# Patient Record
Sex: Male | Born: 1965 | Race: White | Hispanic: No | Marital: Married | Smoking: Never smoker
Health system: Southern US, Community
[De-identification: ages and names within clinical notes are randomized; demographics above are authoritative.]

## PROBLEM LIST (undated history)

## (undated) DIAGNOSIS — C4490 Unspecified malignant neoplasm of skin, unspecified: Secondary | ICD-10-CM

## (undated) HISTORY — DX: Unspecified malignant neoplasm of skin, unspecified: C44.90

---

## 2013-06-20 ENCOUNTER — Ambulatory Visit (INDEPENDENT_AMBULATORY_CARE_PROVIDER_SITE_OTHER): Payer: BC Managed Care – PPO | Admitting: Family Medicine

## 2013-06-20 ENCOUNTER — Encounter: Payer: Self-pay | Admitting: Family Medicine

## 2013-06-20 VITALS — BP 124/81 | HR 56 | Ht 71.0 in | Wt 194.0 lb

## 2013-06-20 DIAGNOSIS — Z13220 Encounter for screening for lipoid disorders: Secondary | ICD-10-CM

## 2013-06-20 DIAGNOSIS — Z85828 Personal history of other malignant neoplasm of skin: Secondary | ICD-10-CM | POA: Insufficient documentation

## 2013-06-20 DIAGNOSIS — N529 Male erectile dysfunction, unspecified: Secondary | ICD-10-CM

## 2013-06-20 DIAGNOSIS — L219 Seborrheic dermatitis, unspecified: Secondary | ICD-10-CM

## 2013-06-20 DIAGNOSIS — Z0000 Encounter for general adult medical examination without abnormal findings: Secondary | ICD-10-CM

## 2013-06-20 DIAGNOSIS — Z131 Encounter for screening for diabetes mellitus: Secondary | ICD-10-CM

## 2013-06-20 MED ORDER — KETOCONAZOLE 2 % EX SHAM: 1.0000 "application " | MEDICATED_SHAMPOO | CUTANEOUS | Status: DC

## 2013-06-20 MED ORDER — TADALAFIL 20 MG PO TABS: 20.0000 mg | ORAL_TABLET | Freq: Every day | ORAL | Status: DC | PRN

## 2013-06-20 NOTE — Progress Notes (Signed)
CC: Dennis Stephenson is a 48 y.o. male is here for Establish Care   Subjective: HPI:  Colonoscopy: No current indication Prostate: Discussed screening risks/beneifts with patient today, he reports it PSAs were checked more frequently than annually at his former urology office and were always less than 4 to his knowledge.  Influenza Vaccine: Out of Season Pneumovax: No current indication Td/Tdap: He is unsure about this we are requesting outside records Zoster: (Start 48 yo)  Very pleasant 48 year old here to establish care requesting complete physical exam.  Requesting refills for Cialis due to erectile dysfunction described as difficulty maintaining an erection. He takes a 20 mg dose he will provide relief of the above issues for about a length of the weekend.  Requesting counseling on dandruff that has been present for matter of years with no benefit from [facility] or any other over-the-counter shampoos  Review of Systems - General ROS: negative for - chills, fever, night sweats, weight gain or weight loss Ophthalmic ROS: negative for - decreased vision Psychological ROS: negative for - anxiety or depression ENT ROS: negative for - hearing change, nasal congestion, tinnitus or allergies Hematological and Lymphatic ROS: negative for - bleeding problems, bruising or swollen lymph nodes Breast ROS: negative Respiratory ROS: no cough, shortness of breath, or wheezing Cardiovascular ROS: no chest pain or dyspnea on exertion Gastrointestinal ROS: no abdominal pain, change in bowel habits, or black or bloody stools Genito-Urinary ROS: negative for - genital discharge, genital ulcers, incontinence or abnormal bleeding from genitals Musculoskeletal ROS: negative for - joint pain or muscle pain Neurological ROS: negative for - headaches or memory loss Dermatological ROS: negative for lumps, mole changes, rash and skin lesion changes other than that described above  Past Medical History   Diagnosis Date  . Skin cancer     x 3    History reviewed. No pertinent past surgical history. History reviewed. No pertinent family history.  History   Social History  . Marital Status: Married    Spouse Name: N/A    Number of Children: N/A  . Years of Education: N/A   Occupational History  . Not on file.   Social History Main Topics  . Smoking status: Never Smoker   . Smokeless tobacco: Not on file  . Alcohol Use: Not on file     Comment: 1/2  . Drug Use: Yes  . Sexual Activity: Yes    Partners: Female   Other Topics Concern  . Not on file   Social History Narrative  . No narrative on file     Objective: BP 124/81  Pulse 56  Ht 5\' 11"  (1.803 m)  Wt 194 lb (87.998 kg)  BMI 27.07 kg/m2  General: No Acute Distress HEENT: Atraumatic, normocephalic, conjunctivae normal without scleral icterus.  No nasal discharge, hearing grossly intact, TMs with good landmarks bilaterally with no middle ear abnormalities, posterior pharynx clear without oral lesions. Neck: Supple, trachea midline, no cervical nor supraclavicular adenopathy. Pulmonary: Clear to auscultation bilaterally without wheezing, rhonchi, nor rales. Cardiac: Regular rate and rhythm.  No murmurs, rubs, nor gallops. No peripheral edema.  2+ peripheral pulses bilaterally. Abdomen: Soft flat nontender MSK: Grossly intact, no signs of weakness.  Full strength throughout upper and lower extremities.  Full ROM in upper and lower extremities.  No midline spinal tenderness. Neuro: Gait unremarkable, CN II-XII grossly intact.  C5-C6 Reflex 2/4 Bilaterally, L4 Reflex 2/4 Bilaterally.  Cerebellar function intact. Skin: No rashes. Psych: Alert and oriented to person/place/time.  Thought process normal. No anxiety/depression.  Assessment & Plan: Neely was seen today for establish care.  Diagnoses and associated orders for this visit:  Annual physical exam  Diabetes mellitus screening - BASIC METABOLIC PANEL WITH  GFR  Lipid screening - Lipid panel  History of skin cancer - Ambulatory referral to Dermatology  Erectile dysfunction - Cancel: Ambulatory referral to Dermatology - tadalafil (CIALIS) 20 MG tablet; Take 1 tablet (20 mg total) by mouth daily as needed for erectile dysfunction.  Seborrheic dermatitis - ketoconazole (NIZORAL) 2 % shampoo; Apply 1 application topically 2 (two) times a week.    Healthy lifestyle interventions including but not limited to regular exercise, a healthy low fat diet, moderation of salt intake, the dangers of tobacco/alcohol/recreational drug use, nutrition supplementation, and accident avoidance were discussed with the patient and a handout was provided for future reference. Dermatology referral for routine followup of his history of skin cancers, he believes it was not melanoma rather basal cell or squamous cell Erectile dysfunction: Control and as needed Cialis Seborrheic dermatitis, start ketoconazole   Return in about 1 year (around 06/21/2014).

## 2013-07-10 ENCOUNTER — Encounter: Payer: Self-pay | Admitting: Family Medicine

## 2013-12-24 ENCOUNTER — Other Ambulatory Visit: Payer: Self-pay | Admitting: *Deleted

## 2013-12-24 MED ORDER — TADALAFIL 20 MG PO TABS: 20.0000 mg | ORAL_TABLET | Freq: Every day | ORAL | Status: DC | PRN

## 2014-08-05 ENCOUNTER — Telehealth: Payer: Self-pay | Admitting: *Deleted

## 2014-08-05 NOTE — Telephone Encounter (Signed)
Pt called wanting diflucan called in for a rash on his back. Left message on vm that his rash needed to be evaluated and it has been over a year since he has been seen so definitely needs an appointment

## 2014-08-18 ENCOUNTER — Encounter: Payer: Self-pay | Admitting: Family Medicine

## 2014-08-18 ENCOUNTER — Ambulatory Visit (INDEPENDENT_AMBULATORY_CARE_PROVIDER_SITE_OTHER): Payer: BLUE CROSS/BLUE SHIELD | Admitting: Family Medicine

## 2014-08-18 VITALS — BP 131/81 | HR 40 | Ht 71.0 in | Wt 191.0 lb

## 2014-08-18 DIAGNOSIS — Z0000 Encounter for general adult medical examination without abnormal findings: Secondary | ICD-10-CM | POA: Diagnosis not present

## 2014-08-18 DIAGNOSIS — H9193 Unspecified hearing loss, bilateral: Secondary | ICD-10-CM

## 2014-08-18 DIAGNOSIS — H9190 Unspecified hearing loss, unspecified ear: Secondary | ICD-10-CM | POA: Insufficient documentation

## 2014-08-18 MED ORDER — SILDENAFIL CITRATE 20 MG PO TABS: ORAL_TABLET | ORAL | Status: DC

## 2014-08-18 MED ORDER — FLUCONAZOLE 100 MG PO TABS: ORAL_TABLET | ORAL | Status: DC

## 2014-08-18 NOTE — Progress Notes (Signed)
CC: Dennis Stephenson is a 49 y.o. male is here for Annual Exam   Subjective: HPI:  Colonoscopy: No current indication Prostate: Discussed screening risks/beneifts with patient today, no current indication   Influenza Vaccine: No current indication Pneumovax: No current indication Td/Tdap: UTD Zoster: (Start 49 yo)  He would like another something cheaper than Cialis, this medication works great for erectile dysfunction but is $70 for 6 tablets and financially this is costly for him.  Complains of itching on the back present for the last couple of months. It's responded to fluconazole past no intervention over the past few months.  He's had a little bit of decreased hearing over the past year. He is not sure selective hearing or something more serious. It seems to affect all frequencies in both ears, mild in severity   Review of Systems - General ROS: negative for - chills, fever, night sweats, weight gain or weight loss Ophthalmic ROS: negative for - decreased vision Psychological ROS: negative for - anxiety or depression ENT ROS: negative for - , nasal congestion, tinnitus or allergies Hematological and Lymphatic ROS: negative for - bleeding problems, bruising or swollen lymph nodes Breast ROS: negative Respiratory ROS: no cough, shortness of breath, or wheezing Cardiovascular ROS: no chest pain or dyspnea on exertion Gastrointestinal ROS: no abdominal pain, change in bowel habits, or black or bloody stools Genito-Urinary ROS: negative for - genital discharge, genital ulcers, incontinence or abnormal bleeding from genitals Musculoskeletal ROS: negative for - joint pain or muscle pain Neurological ROS: negative for - headaches or memory loss Dermatological ROS: negative for lumps, mole changes, rash and skin lesion changes other than that described above  Past Medical History  Diagnosis Date  . Skin cancer     x 3    No past surgical history on file. No family history on file.   History   Social History  . Marital Status: Married    Spouse Name: N/A  . Number of Children: N/A  . Years of Education: N/A   Occupational History  . Not on file.   Social History Main Topics  . Smoking status: Never Smoker   . Smokeless tobacco: Not on file  . Alcohol Use: Not on file     Comment: 1/2  . Stephenson Use: Yes  . Sexual Activity:    Partners: Female   Other Topics Concern  . Not on file   Social History Narrative     Objective: BP 131/81 mmHg  Pulse 40  Ht 5\' 11"  (1.803 m)  Wt 191 lb (86.637 kg)  BMI 26.65 kg/m2  General: No Acute Distress HEENT: Atraumatic, normocephalic, conjunctivae normal without scleral icterus.  No nasal discharge, hearing grossly intact, TMs with good landmarks bilaterally with no middle ear abnormalities, posterior pharynx clear without oral lesions. Neck: Supple, trachea midline, no cervical nor supraclavicular adenopathy. Pulmonary: Clear to auscultation bilaterally without wheezing, rhonchi, nor rales. Cardiac: Regular rate and rhythm.  No murmurs, rubs, nor gallops. No peripheral edema.  2+ peripheral pulses bilaterally. Abdomen: Bowel sounds normal.  No masses.  Non-tender without rebound.  Negative Murphy's sign. MSK: Grossly intact, no signs of weakness.  Full strength throughout upper and lower extremities.  Full ROM in upper and lower extremities.  No midline spinal tenderness. Neuro: Gait unremarkable, CN II-XII grossly intact.  C5-C6 Reflex 2/4 Bilaterally, L4 Reflex 2/4 Bilaterally.  Cerebellar function intact. Skin: Mild tinea versicolor on the back Psych: Alert and oriented to person/place/time.  Thought process normal. No anxiety/depression.  Assessment & Plan: Dennis Stephenson was seen today for annual exam.  Diagnoses and all orders for this visit:  Annual physical exam Orders: -     Lipid panel -     COMPLETE METABOLIC PANEL WITH GFR -     CBC  Hearing loss, bilateral  Other orders -     sildenafil (REVATIO) 20 MG  tablet; 2-3 by mouth as needed for sex -     fluconazole (DIFLUCAN) 100 MG tablet; Two by mouth every week as needed for skin-yeast infection.   Healthy lifestyle interventions including but not limited to regular exercise, a healthy low fat diet, moderation of salt intake, the dangers of tobacco/alcohol/recreational Stephenson use, nutrition supplementation, and accident avoidance were discussed with the patient and a handout was provided for future reference. Generic Viagra prescription provided to take 2 Dennis Stephenson Mild hearing loss bilaterally all frequencies heard at 40 dB, discussed option of formal testing at audiology, joint decision to hold off on this unless symptoms worsen, will recheck in one year. Discussed avoiding loud noises to help minimize any future progression of hearing loss. Diflucan for tinea versicolor on the back.   Return in about 1 year (around 08/18/2015).

## 2014-08-18 NOTE — Patient Instructions (Signed)
Dr. Terriann Difonzo's General Advice Following Your Complete Physical Exam  The Benefits of Regular Exercise: Unless you suffer from an uncontrolled cardiovascular condition, studies strongly suggest that regular exercise and physical activity will add to both the quality and length of your life.  The World Health Organization recommends 150 minutes of moderate intensity aerobic activity every week.  This is best split over 3-4 days a week, and can be as simple as a brisk walk for just over 35 minutes "most days of the week".  This type of exercise has been shown to lower LDL-Cholesterol, lower average blood sugars, lower blood pressure, lower cardiovascular disease risk, improve memory, and increase one's overall sense of wellbeing.  The addition of anaerobic (or "strength training") exercises offers additional benefits including but not limited to increased metabolism, prevention of osteoporosis, and improved overall cholesterol levels.  How Can I Strive For A Low-Fat Diet?: Current guidelines recommend that 25-35 percent of your daily energy (food) intake should come from fats.  One might ask how can this be achieved without having to dissect each meal on a daily basis?  Switch to skim or 1% milk instead of whole milk.  Focus on lean meats such as ground turkey, fresh fish, baked chicken, and lean cuts of beef as your source of dietary protein.  Limit saturated fat consumption to less than 10% of your daily caloric intake.  Limit trans fatty acid consumption primarily by limiting synthetic trans fats such as partially hydrogenated oils (Ex: fried fast foods).  Substitute olive or vegetable oil for solid fats where possible.  Moderation of Salt Intake: Provided you don't carry a diagnosis of congestive heart failure nor renal failure, I recommend a daily allowance of no more than 2300 mg of salt (sodium).  Keeping under this daily goal is associated with a decreased risk of cardiovascular events, creeping  above it can lead to elevated blood pressures and increases your risk of cardiovascular events.  Milligrams (mg) of salt is listed on all nutrition labels, and your daily intake can add up faster than you think.  Most canned and frozen dinners can pack in over half your daily salt allowance in one meal.    Lifestyle Health Risks: Certain lifestyle choices carry specific health risks.  As you may already know, tobacco use has been associated with increasing one's risk of cardiovascular disease, pulmonary disease, numerous cancers, among many other issues.  What you may not know is that there are medications and nicotine replacement strategies that can more than double your chances of successfully quitting.  I would be thrilled to help manage your quitting strategy if you currently use tobacco products.  When it comes to alcohol use, I've yet to find an "ideal" daily allowance.  Provided an individual does not have a medical condition that is exacerbated by alcohol consumption, general guidelines determine "safe drinking" as no more than two standard drinks for a man or no more than one standard drink for a male per day.  However, much debate still exists on whether any amount of alcohol consumption is technically "safe".  My general advice, keep alcohol consumption to a minimum for general health promotion.  If you or others believe that alcohol, tobacco, or recreational drug use is interfering with your life, I would be happy to provide confidential counseling regarding treatment options.  General "Over The Counter" Nutrition Advice: Postmenopausal women should aim for a daily calcium intake of 1200 mg, however a significant portion of this might already be   provided by diets including milk, yogurt, cheese, and other dairy products.  Vitamin D has been shown to help preserve bone density, prevent fatigue, and has even been shown to help reduce falls in the elderly.  Ensuring a daily intake of 800 Units of  Vitamin D is a good place to start to enjoy the above benefits, we can easily check your Vitamin D level to see if you'd potentially benefit from supplementation beyond 800 Units a day.  Folic Acid intake should be of particular concern to women of childbearing age.  Daily consumption of 400-800 mcg of Folic Acid is recommended to minimize the chance of spinal cord defects in a fetus should pregnancy occur.    For many adults, accidents still remain one of the most common culprits when it comes to cause of death.  Some of the simplest but most effective preventitive habits you can adopt include regular seatbelt use, proper helmet use, securing firearms, and regularly testing your smoke and carbon monoxide detectors.  [facility] Stephenson [facility] [address], [address] [place],  [address] Phone: [phone]  

## 2014-08-22 LAB — CBC
HEMATOCRIT: 46.4 % (ref 39.0–52.0)
Hemoglobin: 15.9 g/dL (ref 13.0–17.0)
MCH: 31.2 pg (ref 26.0–34.0)
MCHC: 34.3 g/dL (ref 30.0–36.0)
MCV: 91 fL (ref 78.0–100.0)
MPV: 10.7 fL (ref 8.6–12.4)
PLATELETS: 206 10*3/uL (ref 150–400)
RBC: 5.1 MIL/uL (ref 4.22–5.81)
RDW: 13.4 % (ref 11.5–15.5)
WBC: 4.2 10*3/uL (ref 4.0–10.5)

## 2014-08-22 LAB — COMPLETE METABOLIC PANEL WITH GFR
ALT: 24 U/L (ref 9–46)
AST: 24 U/L (ref 10–40)
Albumin: 4.3 g/dL (ref 3.6–5.1)
Alkaline Phosphatase: 80 U/L (ref 40–115)
BILIRUBIN TOTAL: 0.8 mg/dL (ref 0.2–1.2)
BUN: 18 mg/dL (ref 7–25)
CALCIUM: 9.6 mg/dL (ref 8.6–10.3)
CHLORIDE: 105 mmol/L (ref 98–110)
CO2: 29 mmol/L (ref 20–31)
CREATININE: 0.88 mg/dL (ref 0.60–1.35)
GFR, Est Non African American: >89 mL/min (ref >=60)
Glucose, Bld: 78 mg/dL (ref 65–99)
Potassium: 4.5 mmol/L (ref 3.5–5.3)
Sodium: 141 mmol/L (ref 135–146)
Total Protein: 7.2 g/dL (ref 6.1–8.1)

## 2014-08-22 LAB — LIPID PANEL
Cholesterol: 149 mg/dL (ref 125–200)
HDL: 59 mg/dL (ref >=40)
LDL CALC: 74 mg/dL (ref <130)
Total CHOL/HDL Ratio: 2.5 Ratio (ref <=5.0)
Triglycerides: 79 mg/dL (ref <150)
VLDL: 16 mg/dL (ref <30)

## 2015-01-18 ENCOUNTER — Emergency Department (HOSPITAL_COMMUNITY): Payer: BLUE CROSS/BLUE SHIELD

## 2015-01-18 ENCOUNTER — Encounter (HOSPITAL_COMMUNITY): Payer: Self-pay | Admitting: Emergency Medicine

## 2015-01-18 ENCOUNTER — Emergency Department (HOSPITAL_COMMUNITY)
Admission: EM | Admit: 2015-01-18 | Discharge: 2015-01-18 | Disposition: A | Discharge status: Home / Self Care | Payer: BLUE CROSS/BLUE SHIELD | Attending: Emergency Medicine | Admitting: Emergency Medicine

## 2015-01-18 DIAGNOSIS — Z79899 Other long term (current) drug therapy: Secondary | ICD-10-CM | POA: Insufficient documentation

## 2015-01-18 DIAGNOSIS — R079 Chest pain, unspecified: Secondary | ICD-10-CM | POA: Diagnosis not present

## 2015-01-18 DIAGNOSIS — Z85828 Personal history of other malignant neoplasm of skin: Secondary | ICD-10-CM | POA: Diagnosis not present

## 2015-01-18 LAB — BASIC METABOLIC PANEL
ANION GAP: 8 (ref 5–15)
BUN: 14 mg/dL (ref 6–20)
CHLORIDE: 102 mmol/L (ref 101–111)
CO2: 31 mmol/L (ref 22–32)
Calcium: 9.6 mg/dL (ref 8.9–10.3)
Creatinine, Ser: 0.89 mg/dL (ref 0.61–1.24)
Glucose, Bld: 101 mg/dL — ABNORMAL HIGH (ref 65–99)
POTASSIUM: 4.1 mmol/L (ref 3.5–5.1)
SODIUM: 141 mmol/L (ref 135–145)

## 2015-01-18 LAB — I-STAT TROPONIN, ED
Troponin i, poc: 0 ng/mL (ref 0.00–0.08)
Troponin i, poc: 0 ng/mL (ref 0.00–0.08)

## 2015-01-18 LAB — CBC
HEMATOCRIT: 45 % (ref 39.0–52.0)
HEMOGLOBIN: 15.2 g/dL (ref 13.0–17.0)
MCH: 31 pg (ref 26.0–34.0)
MCHC: 33.8 g/dL (ref 30.0–36.0)
MCV: 91.8 fL (ref 78.0–100.0)
Platelets: 182 10*3/uL (ref 150–400)
RBC: 4.9 MIL/uL (ref 4.22–5.81)
RDW: 12.1 % (ref 11.5–15.5)
WBC: 5.8 10*3/uL (ref 4.0–10.5)

## 2015-01-18 MED ORDER — HYDROCODONE-ACETAMINOPHEN 5-325 MG PO TABS
1.0000 | ORAL_TABLET | Freq: Once | ORAL | Status: AC
  Administered 2015-01-18: 1 via ORAL
  Filled 2015-01-18: qty 1

## 2015-01-18 MED ORDER — IOHEXOL 350 MG/ML SOLN
100.0000 mL | Freq: Once | INTRAVENOUS | Status: AC | PRN
  Administered 2015-01-18: 100 mL via INTRAVENOUS

## 2015-01-18 MED ORDER — TRAMADOL HCL 50 MG PO TABS: 50.0000 mg | ORAL_TABLET | Freq: Four times a day (QID) | ORAL | Status: AC | PRN

## 2015-01-18 NOTE — ED Notes (Signed)
Pt states that he began having lt sided chest pain yesterday that subsided and came back today.  Pt states that he is not SOB and denies NVD.  Has not had this pain before.

## 2015-01-18 NOTE — Discharge Instructions (Signed)
Follow-up with her family doctor this week for recheck

## 2015-01-18 NOTE — ED Notes (Signed)
MD at bedside. 

## 2015-01-18 NOTE — ED Provider Notes (Signed)
CSN: [id number]     Arrival date & time 01/18/15  [id number] History   First MD Initiated Contact with Patient 01/18/15 1200     Chief Complaint  Patient presents with  . Chest Pain     (Consider location/radiation/quality/duration/timing/severity/associated sxs/prior Treatment) Patient is a 49 y.o. male presenting with chest pain. The history is provided by the patient (The patient states that he's been having some left-sided chest pain. The pain does not seem to be aggravated by movement or exertion. Patient exercises at least 5 times a week without any symptoms).  Chest Pain Pain location:  L chest Pain quality: aching   Pain radiates to:  Does not radiate Pain radiates to the back: no   Pain severity:  Mild Onset quality:  Gradual Timing:  Intermittent Chronicity:  New Context: not breathing   Associated symptoms: no abdominal pain, no back pain, no cough, no fatigue and no headache     Past Medical History  Diagnosis Date  . Skin cancer     x 3   No past surgical history on file. No family history on file. Social History  Substance Use Topics  . Smoking status: Never Smoker   . Smokeless tobacco: None  . Alcohol Use: None     Comment: 1/2    Review of Systems  Constitutional: Negative for appetite change and fatigue.  HENT: Negative for congestion, ear discharge and sinus pressure.   Eyes: Negative for discharge.  Respiratory: Negative for cough.   Cardiovascular: Positive for chest pain.  Gastrointestinal: Negative for abdominal pain and diarrhea.  Genitourinary: Negative for frequency and hematuria.  Musculoskeletal: Negative for back pain.  Skin: Negative for rash.  Neurological: Negative for seizures and headaches.  Psychiatric/Behavioral: Negative for hallucinations.      Allergies  Review of patient's allergies indicates no known allergies.  Home Medications   Prior to Admission medications   Medication Sig Start Date End Date Taking? Authorizing  Provider  LYSINE PO Take 1 tablet by mouth daily.    Yes Historical Provider, MD  Multiple Vitamin (MULTIVITAMIN) tablet Take 1 tablet by mouth daily.   Yes Historical Provider, MD  NON FORMULARY Take 1 capsule by mouth 3 (three) times daily. JYM supplement   Yes Historical Provider, MD  sildenafil (REVATIO) 20 MG tablet 2-3 by mouth as needed for sex Patient taking differently: Take 20 mg by mouth as needed (erectile dys). 2-3 by mouth as needed for sex 08/18/14  Yes Sean Hommel, DO  fluconazole (DIFLUCAN) 100 MG tablet Two by mouth every week as needed for skin-yeast infection. Patient not taking: Reported on 01/18/2015 08/18/14   Marcial Pacas, DO  ketoconazole (NIZORAL) 2 % shampoo Apply 1 application topically 2 (two) times a week. Patient not taking: Reported on 01/18/2015 06/20/13   Marcial Pacas, DO  traMADol (ULTRAM) 50 MG tablet Take 1 tablet (50 mg total) by mouth every 6 (six) hours as needed. 01/18/15   Milton Ferguson, MD   BP 136/88 mmHg  Pulse 59  Temp(Src) 98.2 F (36.8 C) (Oral)  Resp 13  SpO2 99% Physical Exam  Constitutional: He is oriented to person, place, and time. He appears well-developed.  HENT:  Head: Normocephalic.  Eyes: Conjunctivae and EOM are normal. No scleral icterus.  Neck: Neck supple. No thyromegaly present.  Cardiovascular: Normal rate and regular rhythm.  Exam reveals no gallop and no friction rub.   No murmur heard. Pulmonary/Chest: No stridor. He has no wheezes. He has no rales. He  exhibits no tenderness.  Abdominal: He exhibits no distension. There is no tenderness. There is no rebound.  Musculoskeletal: Normal range of motion. He exhibits no edema.  Lymphadenopathy:    He has no cervical adenopathy.  Neurological: He is oriented to person, place, and time. He exhibits normal muscle tone. Coordination normal.  Skin: No rash noted. No erythema.  Psychiatric: He has a normal mood and affect. His behavior is normal.    ED Course  Procedures  (including critical care time) Labs Review Labs Reviewed  BASIC METABOLIC PANEL - Abnormal; Notable for the following:    Glucose, Bld 101 (*)    All other components within normal limits  CBC  I-STAT TROPOININ, ED  Randolm Idol, ED    Imaging Review Dg Chest 2 View  01/18/2015  CLINICAL DATA:  Left chest pain since yesterday. EXAM: CHEST  2 VIEW COMPARISON:  None. FINDINGS: The heart size and mediastinal contours are within normal limits. Both lungs are clear. The visualized skeletal structures are unremarkable. IMPRESSION: No active cardiopulmonary disease. Electronically Signed   By: Abelardo Diesel M.D.   On: 01/18/2015 10:57   Ct Angio Chest Pe W/cm &/or Wo Cm  01/18/2015  CLINICAL DATA:  Intermittent left-sided chest pain since 01/17/2015. Initial encounter. EXAM: CT ANGIOGRAPHY CHEST WITH CONTRAST TECHNIQUE: Multidetector CT imaging of the chest was performed using the standard protocol during bolus administration of intravenous contrast. Multiplanar CT image reconstructions and MIPs were obtained to evaluate the vascular anatomy. CONTRAST:  100 mL OMNIPAQUE IOHEXOL 350 MG/ML SOLN COMPARISON:  PA and lateral chest earlier today. FINDINGS: No pulmonary embolus is identified. No pleural or pericardial effusion. Heart size is normal. No axillary, hilar or mediastinal lymphadenopathy. No calcific aortic or coronary atherosclerosis is identified. The lungs are clear. Incidentally imaged upper abdomen shows fatty infiltration of the liver. No focal bony abnormality is identified. Review of the MIP images confirms the above findings. IMPRESSION: Negative for pulmonary embolus.  Negative chest CT. Fatty infiltration of the liver. Electronically Signed   By: Inge Rise M.D.   On: 01/18/2015 14:21   I have personally reviewed and evaluated these images and lab results as part of my medical decision-making.   EKG Interpretation   Date/Time:  Monday January 18 2015 10:02:47  EST Ventricular Rate:  57 PR Interval:  179 QRS Duration: 126 QT Interval:  436 QTC Calculation: 424 R Axis:   75 Text Interpretation:  Sinus rhythm Nonspecific intraventricular conduction  delay Confirmed by Maripat Borba  MD, Tovia Kisner [phone]) on 01/18/2015 12:02:27 PM  Also confirmed by Quinn Quam  MD, Broadus Nolon [phone])  on 01/18/2015 3:20:34 PM      MDM   Final diagnoses:  Chest pain at rest    Labs and CT and EKG unremarkable. Doubt coronary artery disease. Suspect musculoskeletal problem. Patient given Ultram and will follow-up with his PCP this week    Milton Ferguson, MD 01/18/15 1537

## 2015-01-18 NOTE — ED Notes (Signed)
EKG completed at triage by Denisa, given to Dr Lacinda Axon at [phone]

## 2015-01-21 ENCOUNTER — Encounter: Payer: Self-pay | Admitting: Family Medicine

## 2015-01-21 ENCOUNTER — Ambulatory Visit (INDEPENDENT_AMBULATORY_CARE_PROVIDER_SITE_OTHER): Payer: BLUE CROSS/BLUE SHIELD | Admitting: Family Medicine

## 2015-01-21 VITALS — BP 116/76 | HR 59 | Ht 71.0 in | Wt 203.0 lb

## 2015-01-21 DIAGNOSIS — R0789 Other chest pain: Secondary | ICD-10-CM

## 2015-01-21 DIAGNOSIS — R079 Chest pain, unspecified: Secondary | ICD-10-CM | POA: Insufficient documentation

## 2015-01-21 NOTE — Progress Notes (Signed)
       Dennis Stephenson is a 49 y.o. male who presents to [facility] today for  Hospital follow up. Patient was seen int he ED on Dec 26th for chest pain. He had a normal EKG, CT-angiogram of the chest and negative Troponins x2.  He feels completely asymptomatic since the 27th. He notes the chest pain was never exertional. He is able to exercise 5 times a week without any symptoms. No shortness of breath fevers chills nausea vomiting or diarrhea. He feels well otherwise. Pain was located in the left lateral ribs and did not radiate.  Patient does note that he snores and stops breathing at night and feels very tired. He's concerned he may have sleep apnea.   Past Medical History  Diagnosis Date  . Skin cancer     x 3   No past surgical history on file. Social History  Substance Use Topics  . Smoking status: Never Smoker   . Smokeless tobacco: Not on file  . Alcohol Use: Not on file     Comment: 1/2   family history is not on file.  ROS as above Medications: Current Outpatient Prescriptions  Medication Sig Dispense Refill  . fluconazole (DIFLUCAN) 100 MG tablet Two by mouth every week as needed for skin-yeast infection. 20 tablet 0  . ketoconazole (NIZORAL) 2 % shampoo Apply 1 application topically 2 (two) times a week. 120 mL 3  . LYSINE PO Take 1 tablet by mouth daily.     . Multiple Vitamin (MULTIVITAMIN) tablet Take 1 tablet by mouth daily.    . NON FORMULARY Take 1 capsule by mouth 3 (three) times daily. JYM supplement    . sildenafil (REVATIO) 20 MG tablet 2-3 by mouth as needed for sex (Patient taking differently: Take 20 mg by mouth as needed (erectile dys). 2-3 by mouth as needed for sex) 50 tablet 4  . traMADol (ULTRAM) 50 MG tablet Take 1 tablet (50 mg total) by mouth every 6 (six) hours as needed. 20 tablet 0   No current facility-administered medications for this visit.   No  Known Allergies   Exam:  BP 116/76 mmHg  Pulse 59  Ht 5\' 11"  (1.803 m)  Wt 203 lb (92.08 kg)  BMI 28.33 kg/m2 Gen: Well NAD HEENT: EOMI,  MMM Lungs: Normal work of breathing. CTABL Heart: RRR no MRG Abd: NABS, Soft. Nondistended, Nontender Exts: Brisk capillary refill, warm and well perfused.    STOP BANG: Snore:    Yes  Tired:     Yes Observed stop breathing:  Yes Hypertension:   No  BMI >35:   No Age >50:   No Neck > 16 inches:  Yes Male gender:   Yes ------------------------------------------ Total:     5/8  No results found for this or any previous visit (from the past 24 hour(s)). No results found.   Please see individual assessment and plan sections.

## 2015-01-21 NOTE — Assessment & Plan Note (Signed)
Very low risk for cardiac etiology. This was likely costochondritis. Given age we'll obtain an exercise stress EKG test.  Additionally patient has symptoms concerning for sleep apnea that may be contributing. Plan for home sleep study.  Follow-up with PCP.

## 2015-01-21 NOTE — Patient Instructions (Signed)
Thank you for coming in today. We will do a stress test and a home sleep study.  Expect to hear from both groups soon.  Follow up with Dr Lemmie Evens.  Call or go to the emergency room if you get worse, have trouble breathing, have chest pains, or palpitations.   Chest Wall Pain Chest wall pain is pain in or around the bones and muscles of your chest. Sometimes, an injury causes this pain. Sometimes, the cause may not be known. This pain may take several weeks or longer to get better. HOME CARE INSTRUCTIONS  Pay attention to any changes in your symptoms. Take these actions to help with your pain:   Rest as told by your health care provider.   Avoid activities that cause pain. These include any activities that use your chest muscles or your abdominal and side muscles to lift heavy items.   If directed, apply ice to the painful area:  Put ice in a plastic bag.  Place a towel between your skin and the bag.  Leave the ice on for 20 minutes, 2-3 times per day.  Take over-the-counter and prescription medicines only as told by your health care provider.  Do not use tobacco products, including cigarettes, chewing tobacco, and e-cigarettes. If you need help quitting, ask your health care provider.  Keep all follow-up visits as told by your health care provider. This is important. SEEK MEDICAL CARE IF:  You have a fever.  Your chest pain becomes worse.  You have new symptoms. SEEK IMMEDIATE MEDICAL CARE IF:  You have nausea or vomiting.  You feel sweaty or light-headed.  You have a cough with phlegm (sputum) or you cough up blood.  You develop shortness of breath.   This information is not intended to replace advice given to you by your health care provider. Make sure you discuss any questions you have with your health care provider.   Document Released: 01/09/2005 Document Revised: 09/30/2014 Document Reviewed: 04/06/2014 Elsevier Interactive Patient Education [employer].

## 2015-02-08 ENCOUNTER — Encounter (HOSPITAL_BASED_OUTPATIENT_CLINIC_OR_DEPARTMENT_OTHER): Payer: BLUE CROSS/BLUE SHIELD

## 2015-03-10 ENCOUNTER — Ambulatory Visit (INDEPENDENT_AMBULATORY_CARE_PROVIDER_SITE_OTHER): Payer: BLUE CROSS/BLUE SHIELD

## 2015-03-10 DIAGNOSIS — R0789 Other chest pain: Secondary | ICD-10-CM | POA: Diagnosis not present

## 2015-03-10 LAB — EXERCISE TOLERANCE TEST
CHL CUP RESTING HR STRESS: 63 {beats}/min
CHL CUP STRESS STAGE 1 DBP: 98 mmHg
CHL CUP STRESS STAGE 1 SBP: 136 mmHg
CHL CUP STRESS STAGE 10 DBP: 74 mmHg
CHL CUP STRESS STAGE 10 HR: 86 {beats}/min
CHL CUP STRESS STAGE 10 SBP: 114 mmHg
CHL CUP STRESS STAGE 2 GRADE: 0 %
CHL CUP STRESS STAGE 4 HR: 80 {beats}/min
CHL CUP STRESS STAGE 4 SBP: 140 mmHg
CHL CUP STRESS STAGE 4 SPEED: 1.7 mph
CHL CUP STRESS STAGE 5 GRADE: 12 %
CHL CUP STRESS STAGE 5 HR: 98 {beats}/min
CHL CUP STRESS STAGE 6 HR: 129 {beats}/min
CHL CUP STRESS STAGE 7 DBP: 92 mmHg
CHL CUP STRESS STAGE 7 GRADE: 16 %
CHL CUP STRESS STAGE 8 HR: 162 {beats}/min
CHL CUP STRESS STAGE 9 GRADE: 0 %
CHL CUP STRESS STAGE 9 HR: 139 {beats}/min
CHL CUP STRESS STAGE 9 SBP: 188 mmHg
CHL CUP STRESS STAGE 9 SPEED: 1.5 mph
CHL RATE OF PERCEIVED EXERTION: 17
CSEPHR: 95 %
CSEPPMHR: 94 %
Estimated workload: 15.1 METS
Exercise duration (min): 13 min
Exercise duration (sec): 0 s
MPHR: 171 {beats}/min
Peak HR: 162 {beats}/min
Stage 1 Grade: 0 %
Stage 1 HR: 61 {beats}/min
Stage 1 Speed: 0 mph
Stage 10 Grade: 0 %
Stage 10 Speed: 0 mph
Stage 2 HR: 72 {beats}/min
Stage 2 Speed: 0.8 mph
Stage 3 Grade: 0 %
Stage 3 HR: 75 {beats}/min
Stage 3 Speed: 1 mph
Stage 4 DBP: 90 mmHg
Stage 4 Grade: 10 %
Stage 5 Speed: 2.5 mph
Stage 6 DBP: 87 mmHg
Stage 6 Grade: 14 %
Stage 6 SBP: 184 mmHg
Stage 6 Speed: 3.4 mph
Stage 7 HR: 148 {beats}/min
Stage 7 SBP: 139 mmHg
Stage 7 Speed: 4.2 mph
Stage 8 Grade: 18 %
Stage 8 Speed: 4.9 mph
Stage 9 DBP: 81 mmHg

## 2015-03-11 NOTE — Progress Notes (Signed)
Quick Note:  Normal exercise test ______

## 2015-08-19 ENCOUNTER — Encounter: Payer: BLUE CROSS/BLUE SHIELD | Admitting: Family Medicine

## 2015-09-10 ENCOUNTER — Ambulatory Visit (INDEPENDENT_AMBULATORY_CARE_PROVIDER_SITE_OTHER): Payer: BLUE CROSS/BLUE SHIELD | Admitting: Family Medicine

## 2015-09-10 ENCOUNTER — Encounter: Payer: Self-pay | Admitting: Family Medicine

## 2015-09-10 VITALS — BP 141/89 | HR 45 | Wt 204.0 lb

## 2015-09-10 DIAGNOSIS — Z0000 Encounter for general adult medical examination without abnormal findings: Secondary | ICD-10-CM | POA: Diagnosis not present

## 2015-09-10 DIAGNOSIS — Z1211 Encounter for screening for malignant neoplasm of colon: Secondary | ICD-10-CM

## 2015-09-10 MED ORDER — SILDENAFIL CITRATE 20 MG PO TABS: ORAL_TABLET | ORAL | 11 refills | Status: DC

## 2015-09-10 NOTE — Progress Notes (Signed)
CC: Dennis Stephenson is a 50 y.o. male is here for Annual Exam   Subjective: HPI:  Colonoscopy: Due now, he prefers cologuard over colonoscopy Prostate: Discussed screening risks/beneifts with patient today, due for PSA  Influenza Vaccine: Declines today Pneumovax: No current indication Td/Tdap: UTD Zoster: (Start 50 yo)  Requesting complete physical exam with no complaints other than he would like a refill on sildenafil.  Review of Systems - General ROS: negative for - chills, fever, night sweats, weight gain or weight loss Ophthalmic ROS: negative for - decreased vision Psychological ROS: negative for - anxiety or depression ENT ROS: negative for - hearing change, nasal congestion, tinnitus or allergies Hematological and Lymphatic ROS: negative for - bleeding problems, bruising or swollen lymph nodes Breast ROS: negative Respiratory ROS: no cough, shortness of breath, or wheezing Cardiovascular ROS: no chest pain or dyspnea on exertion Gastrointestinal ROS: no abdominal pain, change in bowel habits, or black or bloody stools Genito-Urinary ROS: negative for - genital discharge, genital ulcers, incontinence or abnormal bleeding from genitals Musculoskeletal ROS: negative for - joint pain or muscle pain Neurological ROS: negative for - headaches or memory loss Dermatological ROS: negative for lumps, mole changes, rash and skin lesion changes  Past Medical History:  Diagnosis Date  . Skin cancer    x 3    No past surgical history on file. No family history on file.  Social History   Social History  . Marital status: Married    Spouse name: N/A  . Number of children: N/A  . Years of education: N/A   Occupational History  . Not on file.   Social History Main Topics  . Smoking status: Never Smoker  . Smokeless tobacco: Not on file  . Alcohol use Not on file     Comment: 1/2  . Drug use:   . Sexual activity: Yes    Partners: Female   Other Topics Concern  . Not on  file   Social History Narrative  . No narrative on file     Objective: BP (!) 141/89   Pulse (!) 45   Wt 204 lb (92.5 kg)   BMI 28.45 kg/m   General: No Acute Distress HEENT: Atraumatic, normocephalic, conjunctivae normal without scleral icterus.  No nasal discharge, hearing grossly intact, TMs with good landmarks bilaterally with no middle ear abnormalities, posterior pharynx clear without oral lesions. Neck: Supple, trachea midline, no cervical nor supraclavicular adenopathy. Pulmonary: Clear to auscultation bilaterally without wheezing, rhonchi, nor rales. Cardiac: Regular rate and rhythm.  No murmurs, rubs, nor gallops. No peripheral edema.  2+ peripheral pulses bilaterally. Abdomen: Bowel sounds normal.  No masses.  Non-tender without rebound.  Negative Murphy's sign. MSK: Grossly intact, no signs of weakness.  Full strength throughout upper and lower extremities.  Full ROM in upper and lower extremities.  No midline spinal tenderness. Neuro: Gait unremarkable, CN II-XII grossly intact.  C5-C6 Reflex 2/4 Bilaterally, L4 Reflex 2/4 Bilaterally.  Cerebellar function intact. Skin: No rashes. Psych: Alert and oriented to person/place/time.  Thought process normal. No anxiety/depression.  Assessment & Plan: Dennis Stephenson was seen today for annual exam.  Diagnoses and all orders for this visit:  Annual physical exam -     PSA -     CBC -     COMPLETE METABOLIC PANEL WITH GFR -     Lipid panel -     Cancel: Ambulatory referral to Gastroenterology  Special screening for malignant neoplasms, colon -     Cancel:  Ambulatory referral to Gastroenterology  Other orders -     sildenafil (REVATIO) 20 MG tablet; 2-3 by mouth as needed for sex  Healthy lifestyle interventions including but not limited to regular exercise, a healthy low fat diet, moderation of salt intake, the dangers of tobacco/alcohol/recreational drug use, nutrition supplementation, and accident avoidance were discussed with  the patient and a handout was provided for future reference.  Discussed with this patient that I will be resigning from my position here with [facility] in September in order to stay with my family who will be moving to [facility]. I let him know about the providers that are still accepting patients and I feel that this individual will be under great care if he/she stays here with [facility].   Return in about 1 year (around 09/09/2016) for physical.

## 2015-09-23 ENCOUNTER — Other Ambulatory Visit: Payer: Self-pay | Admitting: Emergency Medicine

## 2015-09-23 MED ORDER — SILDENAFIL CITRATE 20 MG PO TABS: ORAL_TABLET | ORAL | 11 refills | Status: AC

## 2015-10-12 LAB — CBC
HCT: 46.3 % (ref 38.5–50.0)
Hemoglobin: 16.3 g/dL (ref 13.2–17.1)
MCH: 32.3 pg (ref 27.0–33.0)
MCHC: 35.2 g/dL (ref 32.0–36.0)
MCV: 91.9 fL (ref 80.0–100.0)
MPV: 11 fL (ref 7.5–12.5)
PLATELETS: 182 10*3/uL (ref 140–400)
RBC: 5.04 MIL/uL (ref 4.20–5.80)
RDW: 13.1 % (ref 11.0–15.0)
WBC: 4.7 10*3/uL (ref 3.8–10.8)

## 2015-10-13 LAB — LIPID PANEL
Cholesterol: 160 mg/dL (ref 125–200)
HDL: 52 mg/dL (ref >=40)
LDL CALC: 81 mg/dL (ref <130)
TRIGLYCERIDES: 134 mg/dL (ref <150)
Total CHOL/HDL Ratio: 3.1 Ratio (ref <=5.0)
VLDL: 27 mg/dL (ref <30)

## 2015-10-13 LAB — COMPLETE METABOLIC PANEL WITH GFR
ALT: 45 U/L (ref 9–46)
AST: 33 U/L (ref 10–35)
Albumin: 4.3 g/dL (ref 3.6–5.1)
Alkaline Phosphatase: 66 U/L (ref 40–115)
BILIRUBIN TOTAL: 0.6 mg/dL (ref 0.2–1.2)
BUN: 15 mg/dL (ref 7–25)
CHLORIDE: 106 mmol/L (ref 98–110)
CO2: 28 mmol/L (ref 20–31)
CREATININE: 1.03 mg/dL (ref 0.70–1.33)
Calcium: 9.1 mg/dL (ref 8.6–10.3)
GFR, Est Non African American: 84 mL/min (ref >=60)
GLUCOSE: 89 mg/dL (ref 65–99)
Potassium: 4.1 mmol/L (ref 3.5–5.3)
Sodium: 140 mmol/L (ref 135–146)
TOTAL PROTEIN: 6.9 g/dL (ref 6.1–8.1)

## 2015-10-13 LAB — PSA: PSA: 0.5 ng/mL (ref <=4.0)

## 2015-10-16 LAB — COLOGUARD: COLOGUARD: NEGATIVE

## 2015-10-29 ENCOUNTER — Encounter: Payer: Self-pay | Admitting: Family Medicine

## 2017-02-23 IMAGING — CR DG CHEST 2V
2 series · 2 of 2 positions shown · non-contrast
Comparison: None.

CLINICAL DATA: Left chest pain since yesterday.

EXAM:
CHEST  2 VIEW

[w chest pa]
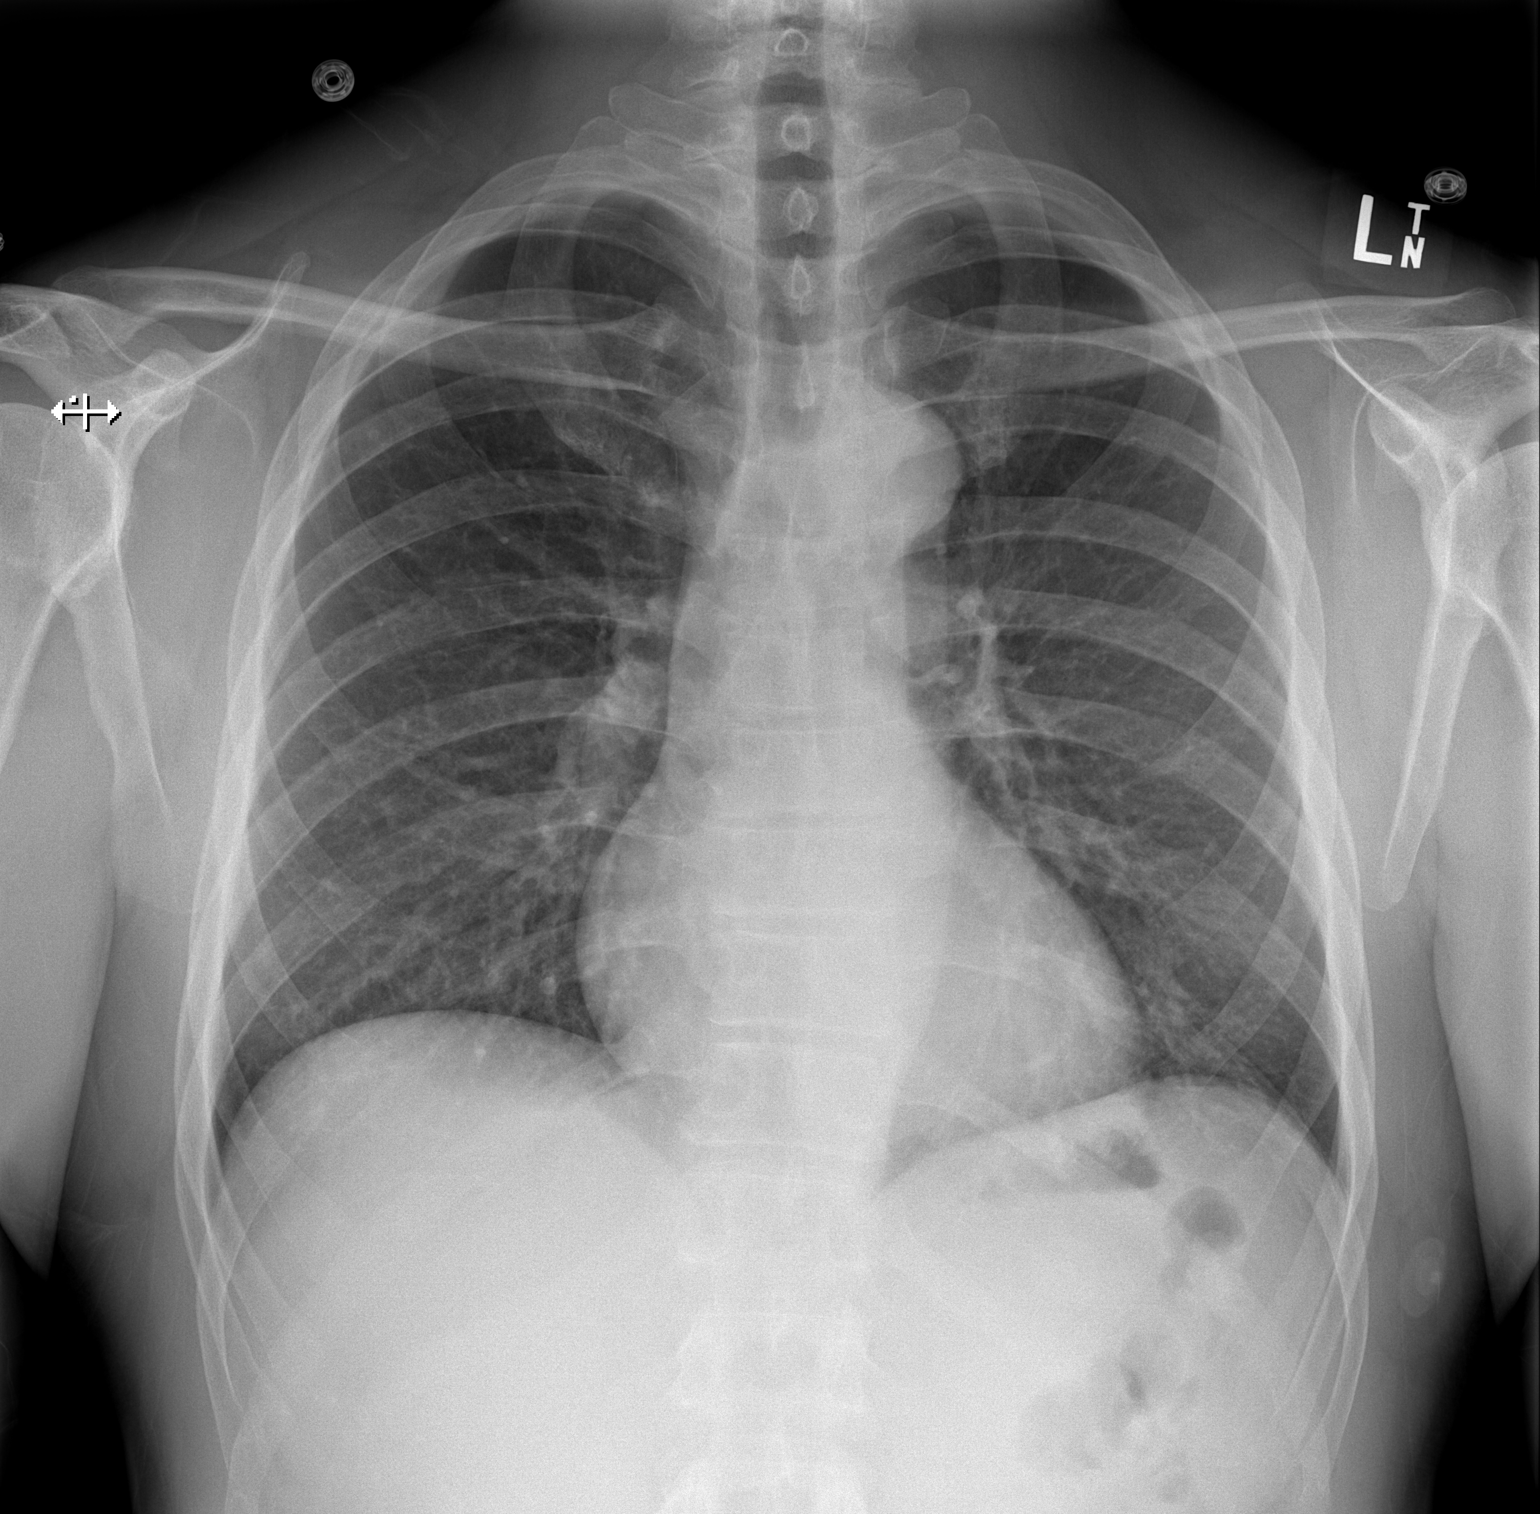

[w chest lat]
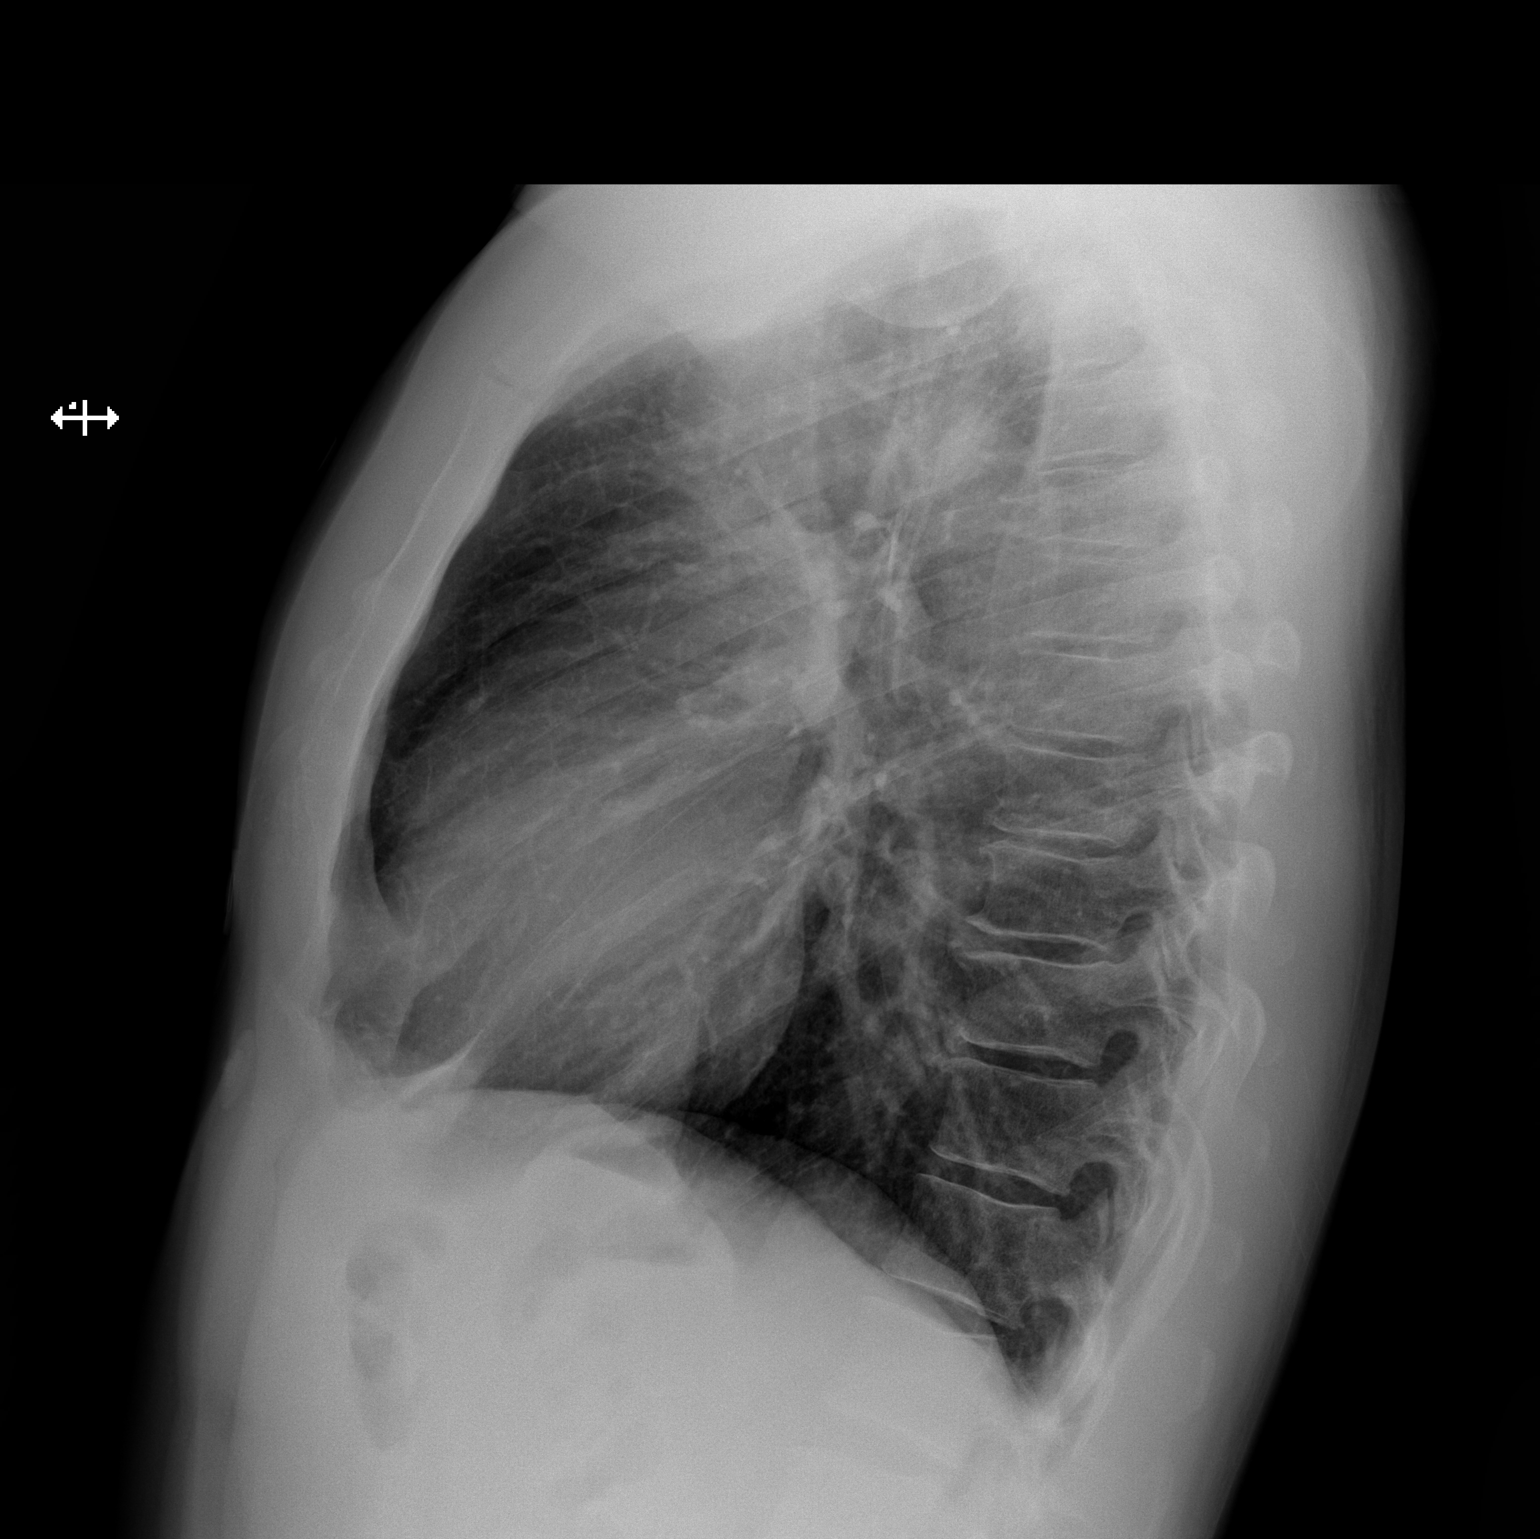

[2 of 2 positions shown; findings below may reference images not displayed]

FINDINGS: The heart size and mediastinal contours are within normal limits.
Both lungs are clear. The visualized skeletal structures are
unremarkable.
IMPRESSION: No active cardiopulmonary disease.

## 2017-02-23 IMAGING — CT CT ANGIO CHEST
2 of 6 series · 19 of 36 positions shown · IV contrast (OMNIPAQUE 350)
Comparison: PA and lateral chest earlier today.

CLINICAL DATA: Intermittent left-sided chest pain since 01/17/2015.
Initial encounter.

EXAM:
CT ANGIOGRAPHY CHEST WITH CONTRAST
TECHNIQUE: Multidetector CT imaging of the chest was performed using the
standard protocol during bolus administration of intravenous
contrast. Multiplanar CT image reconstructions and MIPs were
obtained to evaluate the vascular anatomy.
CONTRAST:  100 mL OMNIPAQUE IOHEXOL 350 MG/ML SOLN

[Series 6: thins for pacs · axial · 0.71mm/px · z∈[+1527,+1772]mm · 18 of 273 slices shown]
[im 14/273  lung]
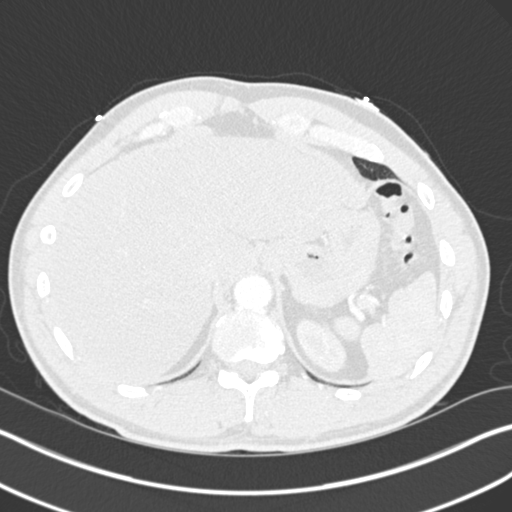
[im 28/273  mediastinal]
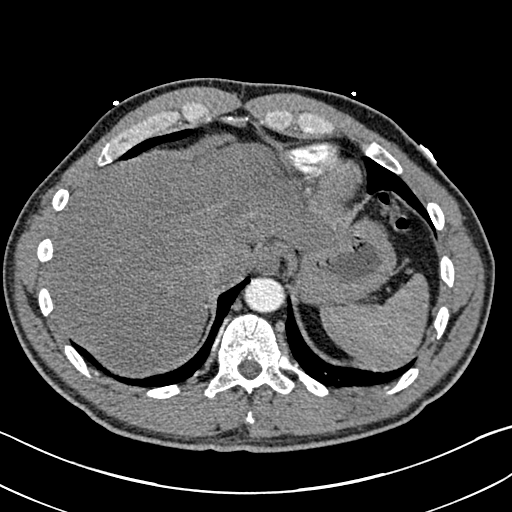
[im 41/273  lung]
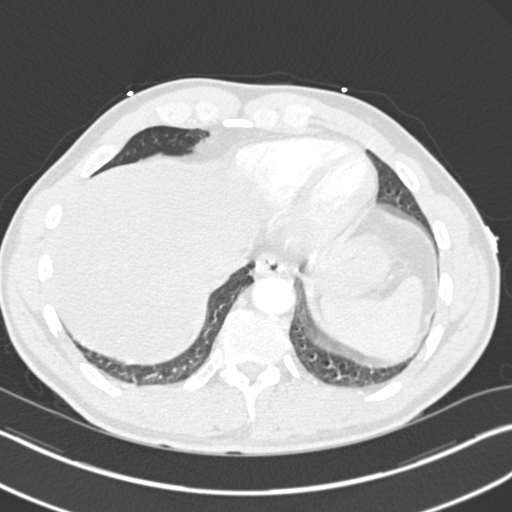
[im 55/273  mediastinal]
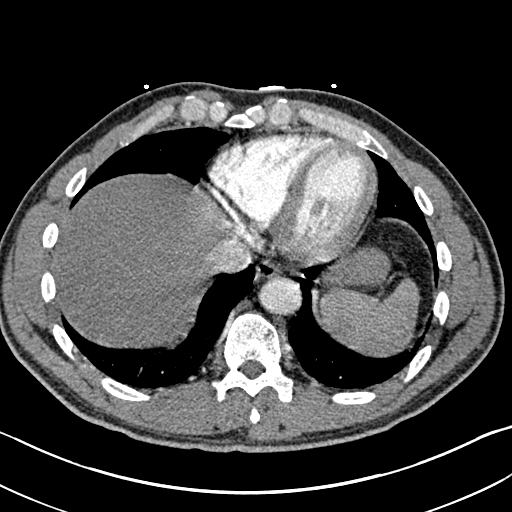
[im 69/273  lung]
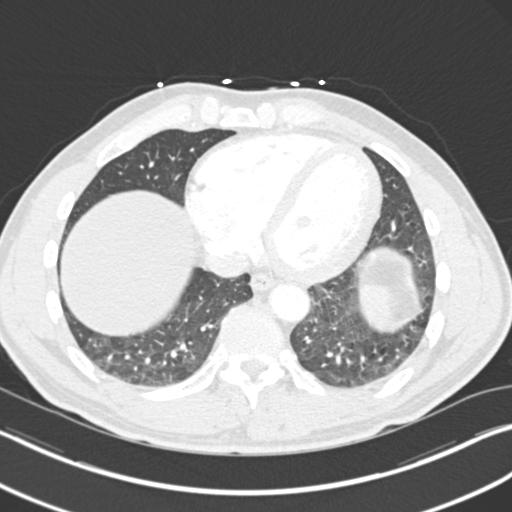
[im 82/273  mediastinal]
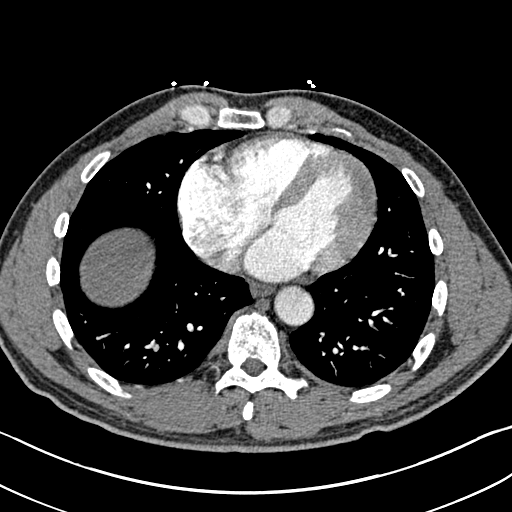
[im 96/273  lung]
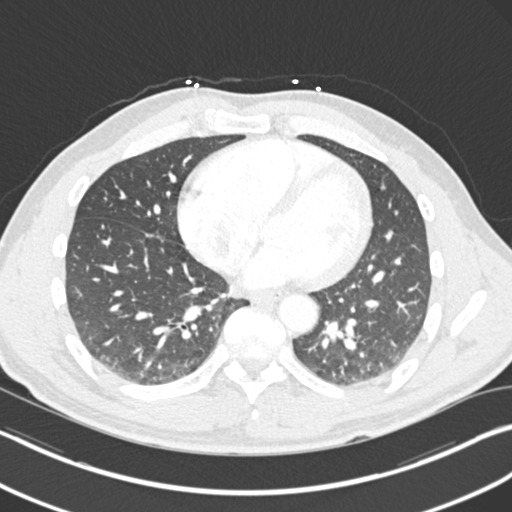
[im 109/273  mediastinal]
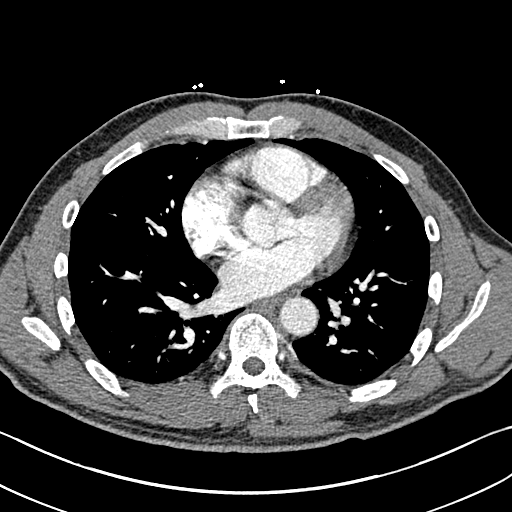
[im 123/273  lung]
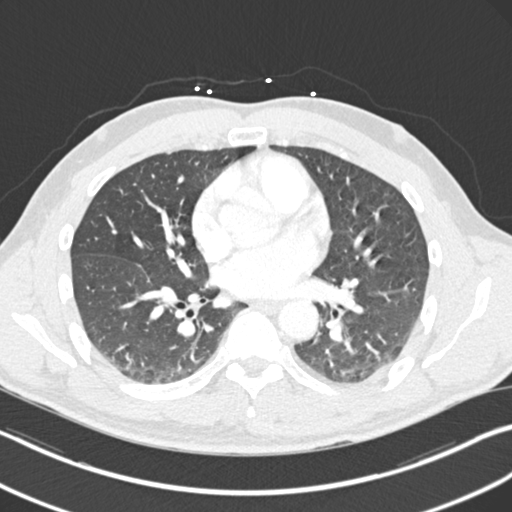
[im 150/273  mediastinal]
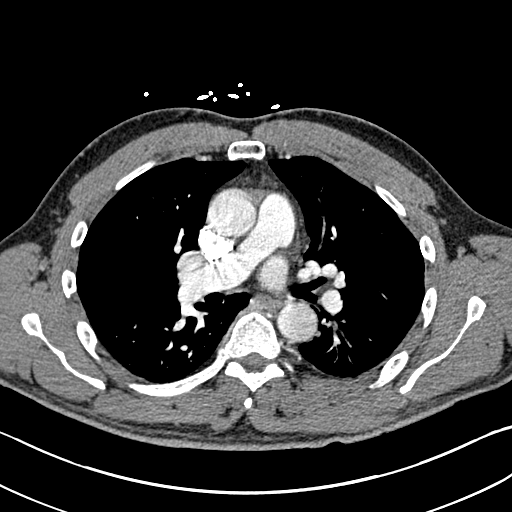
[im 164/273  lung]
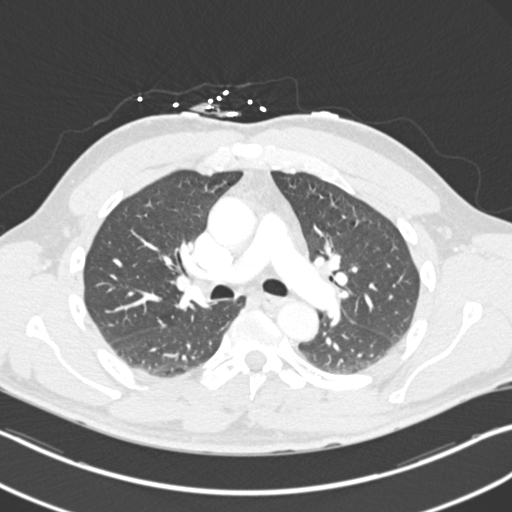
[im 177/273  mediastinal]
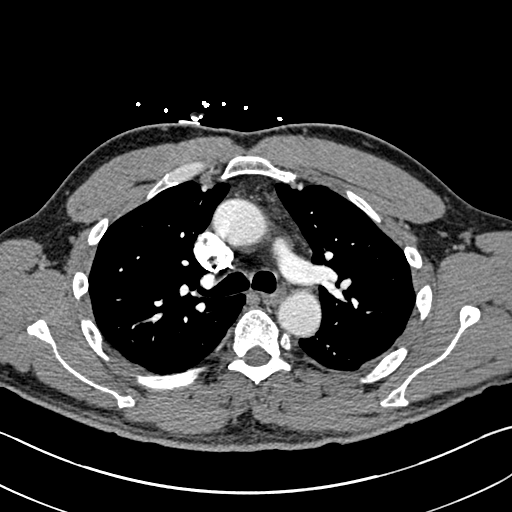
[im 191/273  lung]
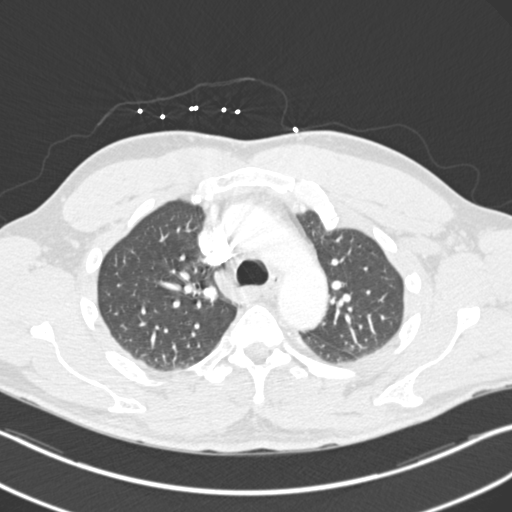
[im 205/273  mediastinal]
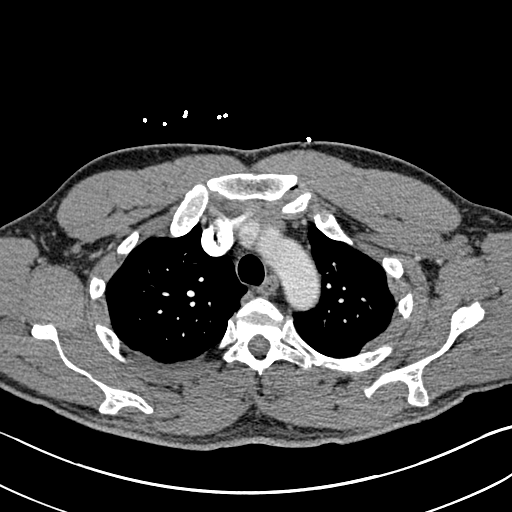
[im 218/273  lung]
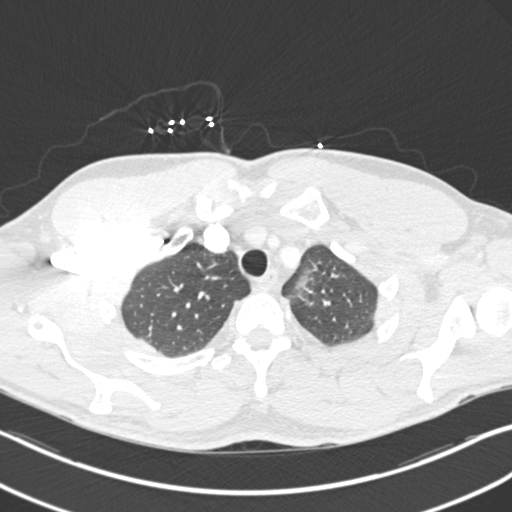
[im 232/273  mediastinal]
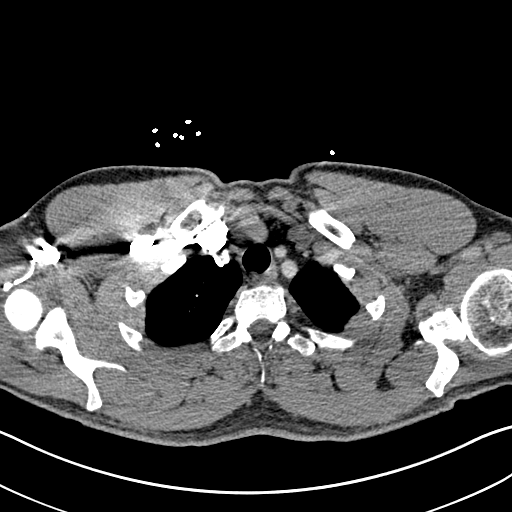
[im 245/273  lung]
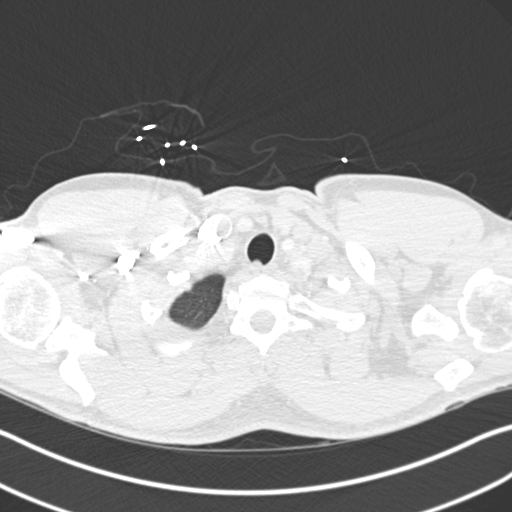
[im 259/273  mediastinal]
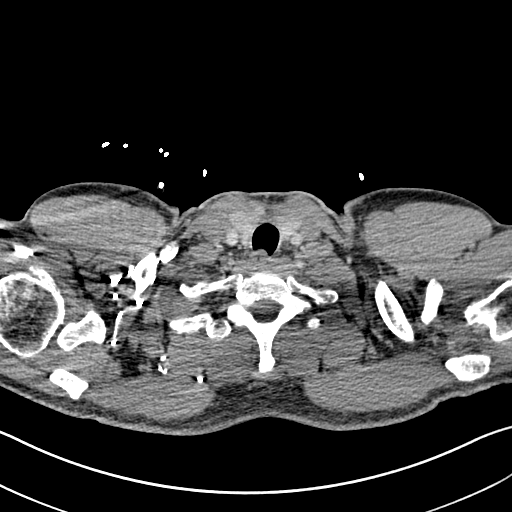

[Series 8: coronal mpr · coronal · 0.53mm/px · 1 of 139 slices shown]
[im 70/139  mediastinal]
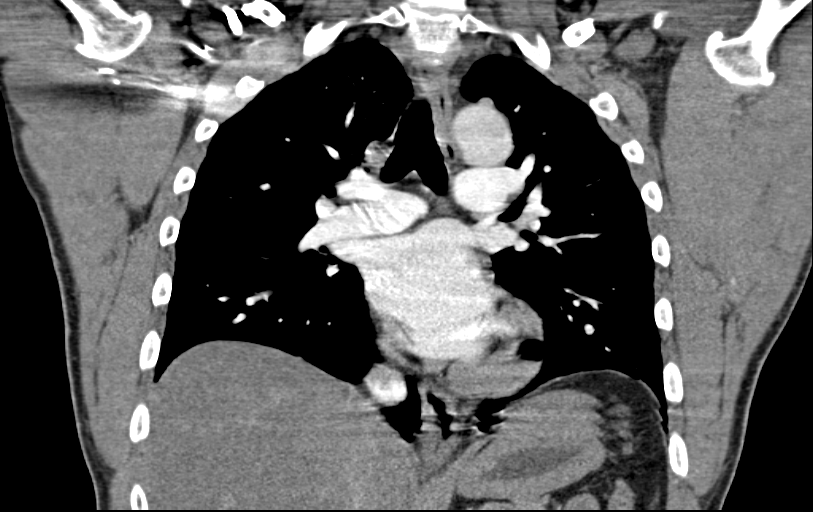

[19 of 36 positions shown; findings below may reference images not displayed]

FINDINGS: No pulmonary embolus is identified. No pleural or pericardial
effusion. Heart size is normal. No axillary, hilar or mediastinal
lymphadenopathy. No calcific aortic or coronary atherosclerosis is
identified. The lungs are clear.

Incidentally imaged upper abdomen shows fatty infiltration of the
liver. No focal bony abnormality is identified.

Review of the MIP images confirms the above findings.
IMPRESSION: Negative for pulmonary embolus.  Negative chest CT.

Fatty infiltration of the liver.

## 2017-06-21 ENCOUNTER — Emergency Department (HOSPITAL_COMMUNITY)
Admission: EM | Admit: 2017-06-21 | Discharge: 2017-06-21 | Disposition: A | Discharge status: Home / Self Care | Payer: BLUE CROSS/BLUE SHIELD | Attending: Physician Assistant | Admitting: Physician Assistant

## 2017-06-21 ENCOUNTER — Emergency Department (HOSPITAL_COMMUNITY): Payer: BLUE CROSS/BLUE SHIELD

## 2017-06-21 ENCOUNTER — Encounter (HOSPITAL_COMMUNITY): Payer: Self-pay

## 2017-06-21 ENCOUNTER — Other Ambulatory Visit: Payer: Self-pay

## 2017-06-21 DIAGNOSIS — Z79899 Other long term (current) drug therapy: Secondary | ICD-10-CM | POA: Insufficient documentation

## 2017-06-21 DIAGNOSIS — R0789 Other chest pain: Secondary | ICD-10-CM | POA: Diagnosis not present

## 2017-06-21 DIAGNOSIS — Z85828 Personal history of other malignant neoplasm of skin: Secondary | ICD-10-CM | POA: Insufficient documentation

## 2017-06-21 DIAGNOSIS — R079 Chest pain, unspecified: Secondary | ICD-10-CM | POA: Diagnosis present

## 2017-06-21 LAB — CBC
HCT: 47 % (ref 39.0–52.0)
Hemoglobin: 16 g/dL (ref 13.0–17.0)
MCH: 31 pg (ref 26.0–34.0)
MCHC: 34 g/dL (ref 30.0–36.0)
MCV: 91.1 fL (ref 78.0–100.0)
Platelets: 213 10*3/uL (ref 150–400)
RBC: 5.16 MIL/uL (ref 4.22–5.81)
RDW: 11.5 % (ref 11.5–15.5)
WBC: 11.9 10*3/uL — ABNORMAL HIGH (ref 4.0–10.5)

## 2017-06-21 LAB — I-STAT TROPONIN, ED
TROPONIN I, POC: 0.02 ng/mL (ref 0.00–0.08)
TROPONIN I, POC: 0.01 ng/mL (ref 0.00–0.08)

## 2017-06-21 LAB — BASIC METABOLIC PANEL
Anion gap: 10 (ref 5–15)
BUN: 18 mg/dL (ref 6–20)
CALCIUM: 9.5 mg/dL (ref 8.9–10.3)
CO2: 25 mmol/L (ref 22–32)
CREATININE: 1.06 mg/dL (ref 0.61–1.24)
Chloride: 103 mmol/L (ref 101–111)
GFR calc Af Amer: >60 mL/min (ref >60)
GLUCOSE: 124 mg/dL — AB (ref 65–99)
Potassium: 4.3 mmol/L (ref 3.5–5.1)
Sodium: 138 mmol/L (ref 135–145)

## 2017-06-21 MED ORDER — GI COCKTAIL ~~LOC~~
30.0000 mL | Freq: Once | ORAL | Status: AC
  Administered 2017-06-21: 30 mL via ORAL
  Filled 2017-06-21: qty 30

## 2017-06-21 MED ORDER — OMEPRAZOLE 20 MG PO CPDR: 20.0000 mg | DELAYED_RELEASE_CAPSULE | Freq: Every day | ORAL | 0 refills | Status: AC

## 2017-06-21 NOTE — Discharge Instructions (Signed)
We are unsure what is causing her discomfort today.  We offered you a CT angio today to rule out a dissection.  However you preferred not to have one today.  You are welcome back any time for this.  Your initial 2 troponins were negative.  This is reassuring that it is not an acute heart attack.  However we can never be 100% sure. There is diagnostic uncertainty therefore if you have any increase in symptoms, or other concerns please return immediately to the emergency department.  Welcome back at any time.  We are treating presumptively for reflux to see if this helps with your symptoms.

## 2017-06-21 NOTE — ED Provider Notes (Signed)
Patient placed in Quick Look pathway, seen and evaluated   Chief Complaint: CP, tick bite  HPI:   Patient states in the past 2 months, he has had multiple tick bites.  One infected in his belly button, improved with abx.  For the past several weeks, he has been having upper back and neck pain/soreness.  This improves with certain positions, worsens with movements.  He came in today for chest pain, which is described as a severe central chest pressure.  No history of heart attack in the past.  Pain is constant.  He is concerned this is 2/2 tick disease.  He denies fevers, rash, fatigue.  ROS: CP  Physical Exam:   Gen: No distress.  No fevers  Neuro: Awake and Alert  Skin: Warm, no rashes.  No palmar rash.    Focused Exam: Clear lung sounds.  Speaking full sentences.  In no distress.  Will order cardiac work-up and add on Lyme and ehrlichiosis antibodies.  Will not test for RMSF, as patient has no symptoms.   Initiation of care has begun. The patient has been counseled on the process, plan, and necessity for staying for the completion/evaluation, and the remainder of the medical screening examination    Franchot Heidelberg, PA-C 06/21/17 1317    Charlesetta Shanks, MD 06/28/17 1356

## 2017-06-21 NOTE — ED Triage Notes (Signed)
Pt states he has had neck pain X2 weeks. He states last night he was having back pain in the middle of his back. Pt reports this morning when he got to work he felt as if there was an arrow in his chest when taking a deep breath.

## 2017-06-21 NOTE — ED Provider Notes (Addendum)
[facility] EMERGENCY DEPARTMENT Provider Note   CSN: [id number] Arrival date & time: 06/21/17  1243     History   Chief Complaint Chief Complaint  Patient presents with  . Chest Pain    HPI Dennis Stephenson is a 52 y.o. male.  HPI   52 year old male presenting with chest pain.  Patient reports that over the course of last night he woke up at 2 AM with some discomfort in his chest.  Was able to go back to sleep after he slept on the couch.  Patient awoke this morning still had the discomfort.  Patient ate normally but felt mildly nauseated.  Told his wife who made to come here to the emergency department.  Unfortunately patient is been waiting for 6 to 7 hours the emergency department.  Initial troponin was negative.  Patient has no past medical medical history significant for hypertension hyperlipidemia diabetes or obesity or smoking.  He is a very healthy lifestyle with running several times a week, eating healthy fruits and vegetables, not past 8 PM.  Of note patient did have a late night lasagna and the night before he woke up in the middle the night.  He is being worked up for IBS as an outpatient.  Patient made no mention of tick which was mentioned in the quick look pathway note.  When asked about it he said that he was not sure but he felt like he should mention in case had something to do with the chest pain.    He reports he also has occasional back pain.  He reports the back pain is been going on for several years.  Often happens after running.    Past Medical History:  Diagnosis Date  . Skin cancer    x 3    Patient Active Problem List   Diagnosis Date Noted  . Chest pain 01/21/2015  . Hearing loss 08/18/2014  . History of skin cancer 06/20/2013  . Erectile dysfunction 06/20/2013  . Seborrheic dermatitis 06/20/2013    History reviewed. No pertinent surgical history.      Home Medications    Prior to Admission medications   Medication Sig  Start Date End Date Taking? Authorizing Provider  CREATINE PO Take 6 capsules by mouth See admin instructions. Take 3 capsules by mouth prior to and 3 capsules after a workout EVERY OTHER DAY   Yes [provider]  dicyclomine (BENTYL) 10 MG capsule Take 10 mg by mouth 4 (four) times daily as needed for spasms.  06/13/17  Yes [provider]  GLUCOSAMINE-CHONDROITIN PO Take 2 tablets by mouth daily.   Yes [provider]  ibuprofen (ADVIL,MOTRIN) 200 MG tablet Take 400-600 mg by mouth every 8 (eight) hours as needed (for pain).    Yes [provider]  LYSINE PO Take 1 capsule by mouth daily.    Yes [provider]  Multiple Vitamins-Minerals (ONE-A-DAY MENS 50+ ADVANTAGE) TABS Take 1 tablet by mouth daily.   Yes [provider]  NON FORMULARY [employer] Men's Healthy Testosterone capsules: Take 3 capsules by mouth once a day   Yes [provider]  sildenafil (REVATIO) 20 MG tablet 2-3 by mouth as needed for sex Patient taking differently: Take 40-60 mg by mouth as needed (as directed for sexual activity).  09/23/15  Yes Hommel, Sean, DO  traMADol (ULTRAM) 50 MG tablet Take 1 tablet (50 mg total) by mouth every 6 (six) hours as needed. Patient not taking: Reported on  06/21/2017 01/18/15   Milton Ferguson, MD    Family History History reviewed. No pertinent family history.  Social History Social History   Tobacco Use  . Smoking status: Never Smoker  . Smokeless tobacco: Never Used  Substance Use Topics  . Alcohol use: Not on file    Comment: 1/2  . Drug use: Yes     Allergies   Patient has no known allergies.   Review of Systems Review of Systems  Constitutional: Negative for activity change.  Respiratory: Negative for shortness of breath.   Cardiovascular: Positive for chest pain.  Gastrointestinal: Negative for abdominal pain.     Physical Exam Updated Vital Signs BP 126/87   Pulse 77   Temp 98.3 F (36.8 C) (Oral)    Resp 18   SpO2 100%   Physical Exam  Constitutional: He is oriented to person, place, and time. He appears well-nourished.  HENT:  Head: Normocephalic.  Eyes: Conjunctivae are normal.  Cardiovascular: Normal rate, regular rhythm, intact distal pulses and normal pulses.  Pulmonary/Chest: Effort normal and breath sounds normal. No accessory muscle usage. No respiratory distress. He has no decreased breath sounds.  Abdominal: Soft. There is no tenderness.  Neurological: He is oriented to person, place, and time.  Skin: Skin is warm and dry. He is not diaphoretic.  Psychiatric: He has a normal mood and affect. His behavior is normal.  Nursing note and vitals reviewed.    ED Treatments / Results  Labs (all labs ordered are listed, but only abnormal results are displayed) Labs Reviewed  BASIC METABOLIC PANEL - Abnormal; Notable for the following components:      Result Value   Glucose, Bld 124 (*)    All other components within normal limits  CBC - Abnormal; Notable for the following components:   WBC 11.9 (*)    All other components within normal limits  EHRLICHIA ANTIBODY PANEL  B. BURGDORFI ANTIBODIES  I-STAT TROPONIN, ED  I-STAT TROPONIN, ED    EKG EKG Interpretation  Date/Time:  Thursday Jun 21 2017 12:53:43 EDT Ventricular Rate:  84 PR Interval:  162 QRS Duration: 106 QT Interval:  388 QTC Calculation: 458 R Axis:   66 Text Interpretation:  Normal sinus rhythm Normal ECG Normal sinus rhythm Confirmed by Thomasene Lot, [place] [phone]) on 06/21/2017 7:41:03 PM   Radiology Dg Chest 2 View  Result Date: 06/21/2017 CLINICAL DATA:  Chest pain.  Back pain. EXAM: CHEST - 2 VIEW COMPARISON:  CT 01/18/2015.  Chest x-ray 01/18/2015. FINDINGS: Mediastinum hilar structures normal. Lungs are clear. No pleural effusion or pneumothorax. Heart size normal. No acute bony abnormality. Degenerative change thoracic spine. IMPRESSION: No acute cardiopulmonary disease. Electronically Signed    By: Marcello Moores  Register   On: 06/21/2017 14:01    Procedures Procedures (including critical care time)  Medications Ordered in ED Medications  gi cocktail (Maalox,Lidocaine,Donnatal) (has no administration in time range)     Initial Impression / Assessment and Plan / ED Course  I have reviewed the triage vital signs and the nursing notes.  Pertinent labs & imaging results that were available during my care of the patient were reviewed by me and considered in my medical decision making (see chart for details).    52 year old male presenting with chest pain.  Patient reports that over the course of last night he woke up at 2 AM with some discomfort in his chest.  Was able to go back to sleep after he slept on the couch.  Patient awoke  this morning still had the discomfort.  Patient ate normally but felt mildly nauseated.  Told his wife who made to come here to the emergency department.  Unfortunately patient is been waiting for 6 to 7 hours the emergency department.  Initial troponin was negative.  Patient has no past medical medical history significant for hypertension hyperlipidemia diabetes or obesity or smoking.  He is a very healthy lifestyle with running several times a week, eating healthy fruits and vegetables, not past 8 PM.  Of note patient did have a late night lasagna and the night before he woke up in the middle the night.  He is being worked up for IBS as an outpatient.  Patient made no mention of tick which was mentioned in the quick look pathway note.  When asked about it he said that he was not sure but he felt like he should mention in case had something to do with the chest pain.    He reports he also has occasional back pain.  He reports the back pain is been going on for several years.  Often happens after running.  8:35 PM Differential includes ACS.  However patient has very few risk factors.  Delta troponin is negative so unlikely to be ACS.  Secondly is possible patient has  dissection, offered CT angios but patient declined.  Third will be that this would be an atypical gastric reflux given the tomatoes late night that led into the 2 AM wake up. (norlmal AST/ALT last week with GI)  We will have him watch what he eats, keep a diary, follow-up with his primary care physician.  Patient refuses CT Angio at this time which I think is reasonable and will have him follow-up with his primary care physician as an outpatient.   Patient appears well, NVS, ambulatory and eating at time of discharge.   Final Clinical Impressions(s) / ED Diagnoses   Final diagnoses:  None    ED Discharge Orders    None       Macarthur Critchley, MD 06/21/17 2053    Macarthur Critchley, MD 07/02/17 2312

## 2017-06-25 LAB — B. BURGDORFI ANTIBODIES: B burgdorferi Ab IgG+IgM: <0.91 {ISR} (ref 0.00–0.90)

## 2017-06-26 LAB — EHRLICHIA ANTIBODY PANEL
E CHAFFEENSIS AB, IGG: NEGATIVE
E CHAFFEENSIS AB, IGM: NEGATIVE
E. Chaffeensis (HME) IgM Titer: NEGATIVE
E.Chaffeensis (HME) IgG: NEGATIVE
